# Patient Record
Sex: Female | Born: 1970 | Race: Black or African American | Hispanic: No | Marital: Single | Smoking: Never smoker
Health system: Southern US, Community
[De-identification: ages and names within clinical notes are randomized; demographics above are authoritative.]

## PROBLEM LIST (undated history)

## (undated) DIAGNOSIS — E119 Type 2 diabetes mellitus without complications: Secondary | ICD-10-CM

## (undated) DIAGNOSIS — U071 COVID-19: Secondary | ICD-10-CM

## (undated) DIAGNOSIS — I10 Essential (primary) hypertension: Secondary | ICD-10-CM

## (undated) HISTORY — PX: TUBAL LIGATION: SHX77

---

## 2012-07-01 ENCOUNTER — Emergency Department: Payer: Self-pay | Admitting: Emergency Medicine

## 2019-08-26 ENCOUNTER — Other Ambulatory Visit: Payer: Self-pay

## 2019-08-26 ENCOUNTER — Emergency Department: Payer: Medicaid Other

## 2019-08-26 DIAGNOSIS — J449 Chronic obstructive pulmonary disease, unspecified: Secondary | ICD-10-CM | POA: Diagnosis not present

## 2019-08-26 DIAGNOSIS — E119 Type 2 diabetes mellitus without complications: Secondary | ICD-10-CM | POA: Diagnosis not present

## 2019-08-26 DIAGNOSIS — Z20822 Contact with and (suspected) exposure to covid-19: Secondary | ICD-10-CM | POA: Diagnosis not present

## 2019-08-26 DIAGNOSIS — H4710 Unspecified papilledema: Secondary | ICD-10-CM | POA: Diagnosis not present

## 2019-08-26 LAB — CBC
HCT: 34.5 % — ABNORMAL LOW (ref 36.0–46.0)
Hemoglobin: 11.1 g/dL — ABNORMAL LOW (ref 12.0–15.0)
MCH: 27.4 pg (ref 26.0–34.0)
MCHC: 32.2 g/dL (ref 30.0–36.0)
MCV: 85.2 fL (ref 80.0–100.0)
Platelets: 355 10*3/uL (ref 150–400)
RBC: 4.05 MIL/uL (ref 3.87–5.11)
RDW: 14.3 % (ref 11.5–15.5)
WBC: 7.3 10*3/uL (ref 4.0–10.5)
nRBC: 0 % (ref 0.0–0.2)

## 2019-08-26 LAB — COMPREHENSIVE METABOLIC PANEL
ALT: 10 U/L (ref 0–44)
AST: 16 U/L (ref 15–41)
Albumin: 3.8 g/dL (ref 3.5–5.0)
Alkaline Phosphatase: 75 U/L (ref 38–126)
Anion gap: 11 (ref 5–15)
BUN: 10 mg/dL (ref 6–20)
CO2: 24 mmol/L (ref 22–32)
Calcium: 9.1 mg/dL (ref 8.9–10.3)
Chloride: 105 mmol/L (ref 98–111)
Creatinine, Ser: 0.76 mg/dL (ref 0.44–1.00)
GFR calc Af Amer: >60 mL/min (ref >60)
GFR calc non Af Amer: >60 mL/min (ref >60)
Glucose, Bld: 90 mg/dL (ref 70–99)
Potassium: 3.6 mmol/L (ref 3.5–5.1)
Sodium: 140 mmol/L (ref 135–145)
Total Bilirubin: 0.7 mg/dL (ref 0.3–1.2)
Total Protein: 8.2 g/dL — ABNORMAL HIGH (ref 6.5–8.1)

## 2019-08-26 IMAGING — MR MR HEAD WO/W CM
13 series · 48 of 48 positions shown · IV contrast (gadavist)
Comparison: None.

CLINICAL DATA: Papilledema

EXAM:
MRI HEAD WITHOUT AND WITH CONTRAST
MRV HEAD WITHOUT AND WITH CONTRAST
TECHNIQUE: Multiplanar, multiecho pulse sequences of the brain and surrounding
structures were obtained without and with intravenous contrast.
Angiographic images of the intracranial venous structures were
obtained using MRV technique without and with intravenous contrast.
CONTRAST:  10mL GADAVIST GADOBUTROL 1 MMOL/ML IV SOLN

[Series 5: ax dwi_tracew · axial · 3.0mm · 0.71mm/px · z∈[-117,+45]mm · 4 of 56 slices shown]
[im 1/56]
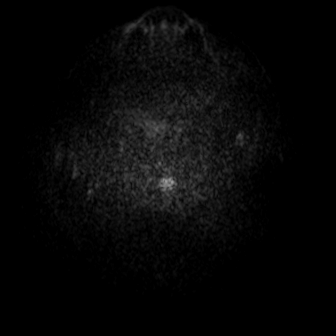
[im 19/56]
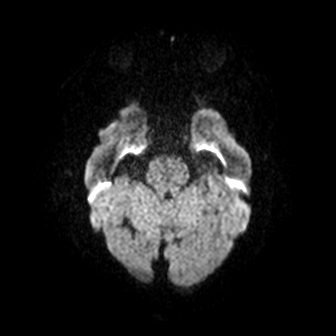
[im 37/56]
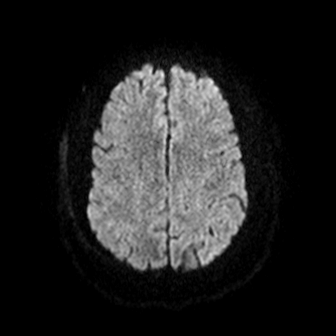
[im 56/56]
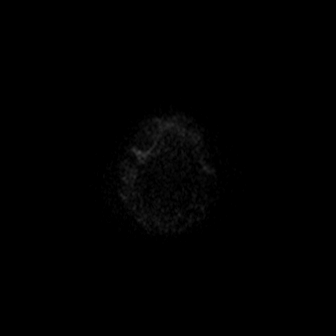

[Series 6: ax dwi_adc · axial · 3.0mm · 0.71mm/px · z∈[-117,+45]mm · 4 of 56 slices shown]
[im 1/56]
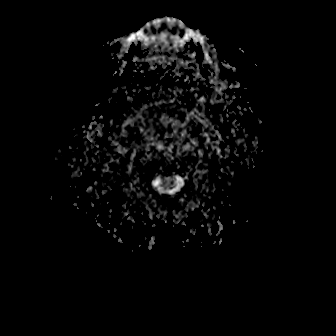
[im 19/56]
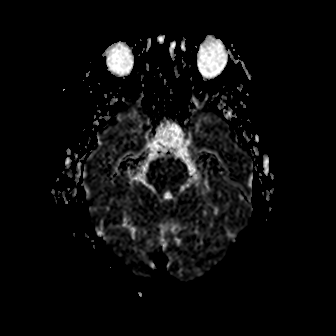
[im 37/56]
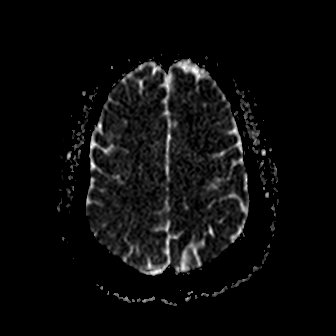
[im 56/56]
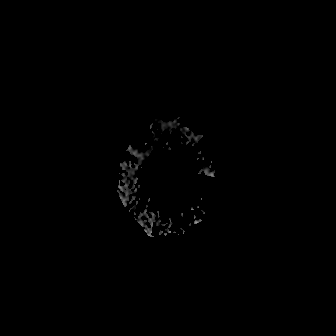

[Series 7: cor dwi_tracew · coronal · 5.0mm · 0.68mm/px · 3 of 40 slices shown]
[im 1/40]
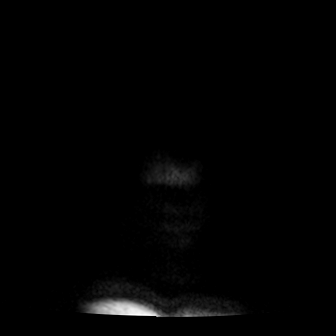
[im 20/40]
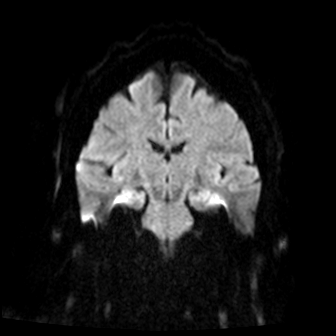
[im 40/40]
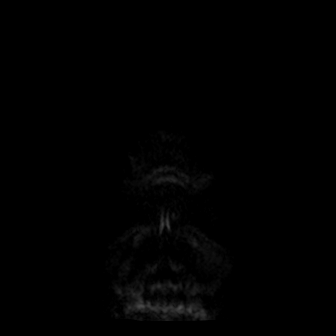

[Series 8: cor dwi_adc · coronal · 5.0mm · 0.68mm/px · 2 of 40 slices shown]
[im 1/40]
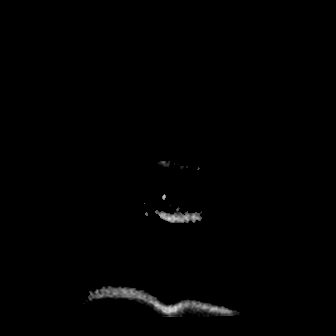
[im 40/40]
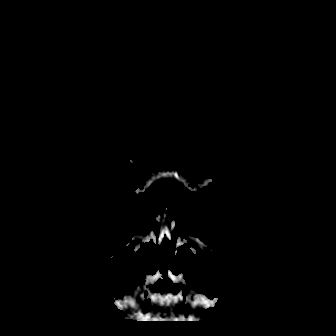

[Series 9: T1 · sagittal · 5.0mm · 0.94mm/px · 1 of 23 slices shown (1 of 2)]
[im 1/23]
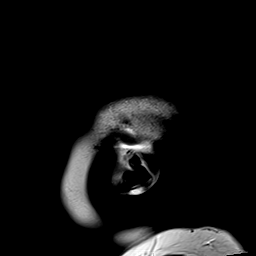

[Series 10: T2 · axial · 5.0mm · 0.53mm/px · 1 of 25 slices shown]
[im 1/25]
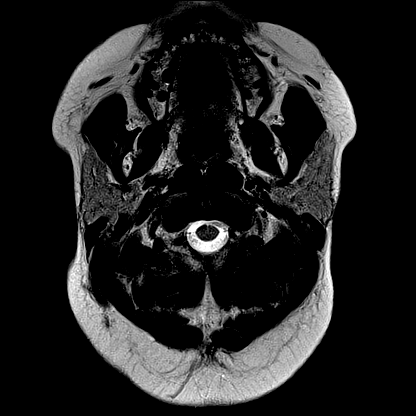

[Series 12: pha_images · axial · 3.0mm · 0.90mm/px · z∈[-119,+55]mm · 3 of 60 slices shown]
[im 1/60]
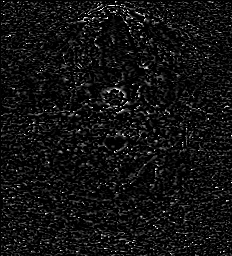
[im 30/60]
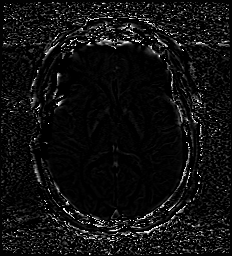
[im 60/60]
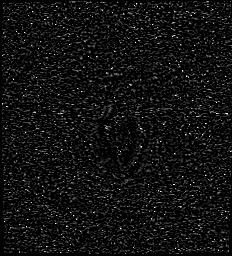

[Series 13: swi_images · axial · 3.0mm · 0.90mm/px · z∈[-119,+55]mm · 3 of 60 slices shown]
[im 1/60]
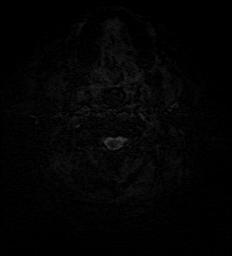
[im 30/60]
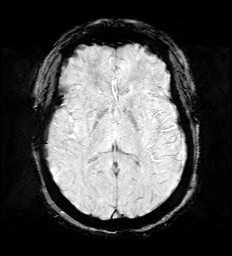
[im 60/60]
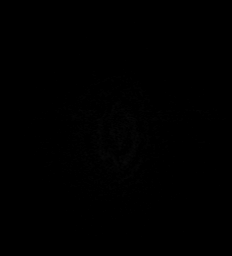

[Series 15: FLAIR · axial · 3.0mm · 0.53mm/px · z∈[-113,+46]mm · 3 of 55 slices shown]
[im 1/55]
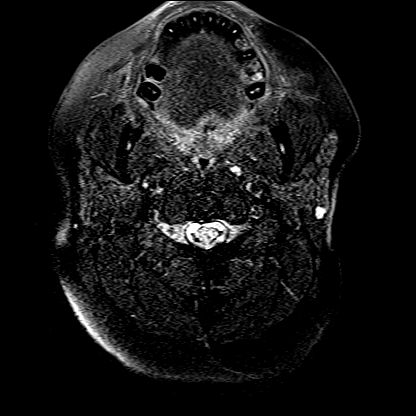
[im 28/55]
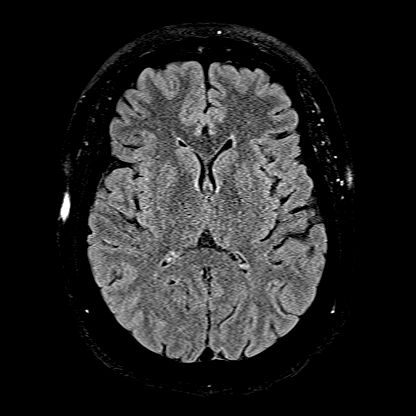
[im 55/55]
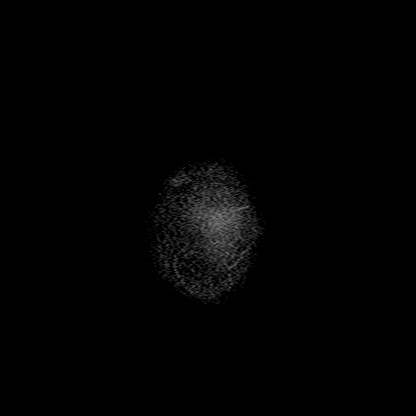

[Series 16: T1 · axial · 1.0mm · 0.98mm/px · z∈[-112,+59]mm · 10 of 176 slices shown (2 of 2)]
[im 1/176]
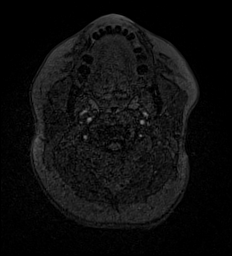
[im 20/176]
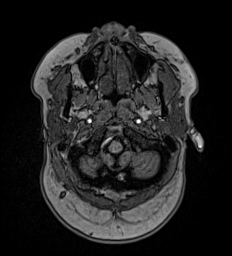
[im 39/176]
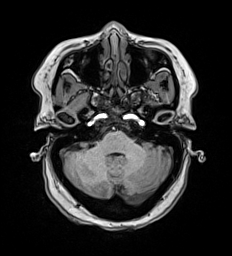
[im 59/176]
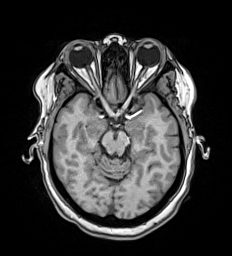
[im 78/176]
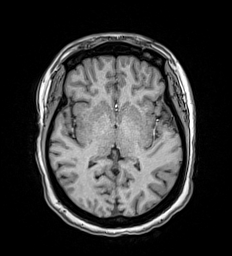
[im 98/176]
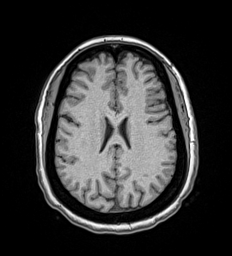
[im 117/176]
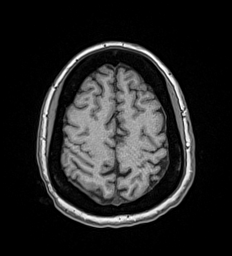
[im 137/176]
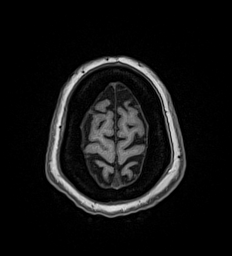
[im 156/176]
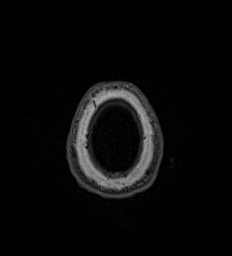
[im 176/176]
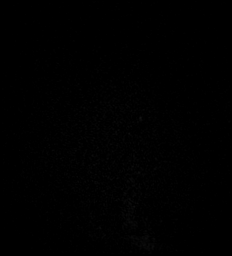

[Series 17: T2 post-contrast · coronal · 5.0mm · 0.57mm/px · 2 of 29 slices shown]
[im 1/29]
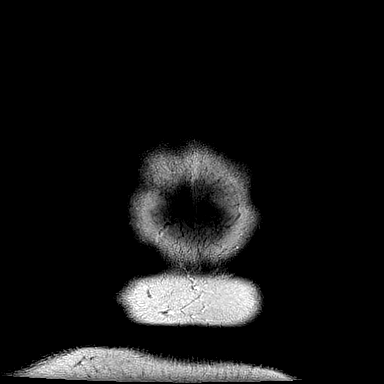
[im 29/29]
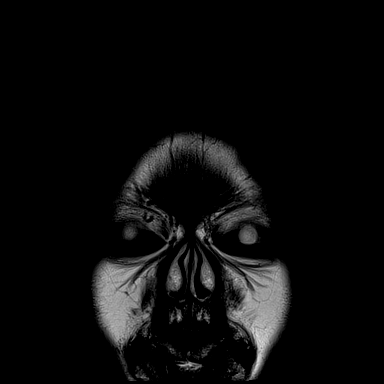

[Series 18: T1 post-contrast · axial · 1.0mm · 0.98mm/px · z∈[-112,+59]mm · 10 of 176 slices shown (1 of 2)]
[im 1/176]
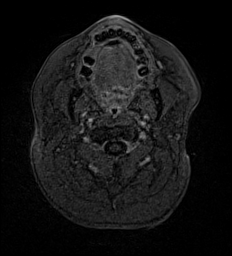
[im 20/176]
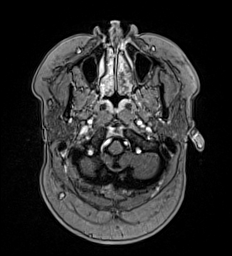
[im 39/176]
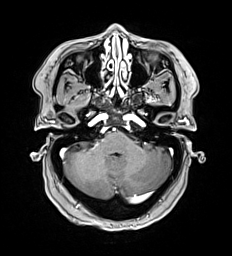
[im 59/176]
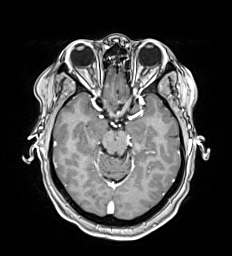
[im 78/176]
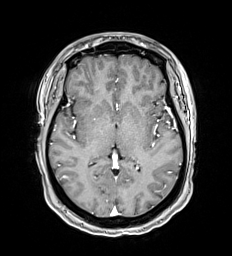
[im 98/176]
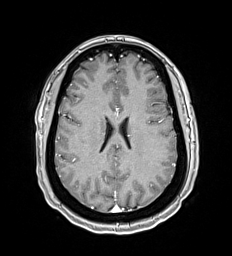
[im 117/176]
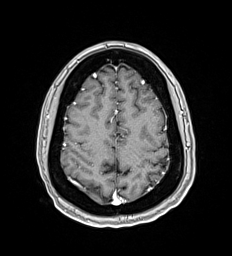
[im 137/176]
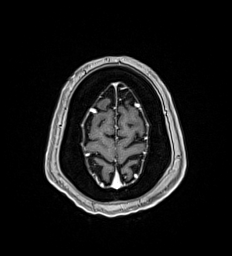
[im 156/176]
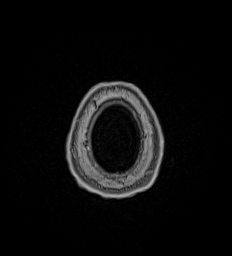
[im 176/176]
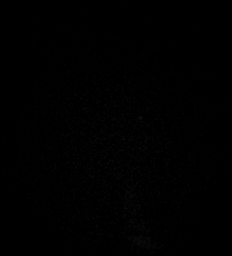

[Series 19: T1 post-contrast · coronal · 5.0mm · 0.57mm/px · 2 of 29 slices shown (2 of 2)]
[im 1/29]
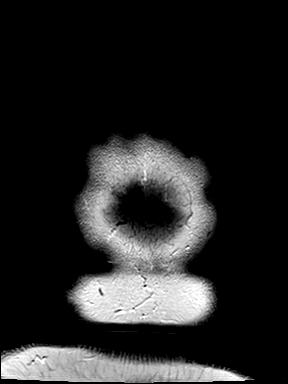
[im 29/29]
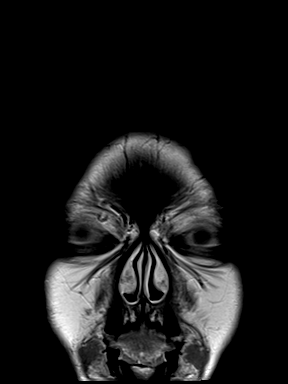

[48 of 48 positions shown; findings below may reference images not displayed]

FINDINGS: MRI head:

Brain: No focal parenchymal signal abnormality. No acute infarct or
intracranial hemorrhage. No midline shift, ventriculomegaly or
extra-axial fluid collection. No mass lesion. No abnormal
enhancement. Partially CSF filled sella turcica.

Vascular: Normal flow voids.

Skull and upper cervical spine: Normal marrow signal.

Sinuses/Orbits: Prominence of the bilateral optic nerve sheaths.
Otherwise unremarkable orbits. Mild left maxillary sinus mucosal
thickening. No mastoid effusion.

Other: None.

MRV head:

Normal appearance of the dural venous sinuses. No evidence of
thrombosis or high-grade narrowing.
IMPRESSION: No acute intracranial process.

Mild bilateral papilledema and partially CSF filled sella turcica
are suspicious for intracranial hypertension.

Normal appearance of the dural venous sinuses.

Mild left maxillary sinus disease.

## 2019-08-26 IMAGING — MR MR MRV HEAD WO/W CM
1 series · 48 of 48 positions shown · IV contrast (gadavist)
Comparison: None.

CLINICAL DATA: Papilledema

EXAM:
MRI HEAD WITHOUT AND WITH CONTRAST
MRV HEAD WITHOUT AND WITH CONTRAST
TECHNIQUE: Multiplanar, multiecho pulse sequences of the brain and surrounding
structures were obtained without and with intravenous contrast.
Angiographic images of the intracranial venous structures were
obtained using MRV technique without and with intravenous contrast.
CONTRAST:  10mL GADAVIST GADOBUTROL 1 MMOL/ML IV SOLN

[Series 1: TOF · coronal · 2.5mm · 0.98mm/px · 48 of 128 slices shown]
[im 1/128]
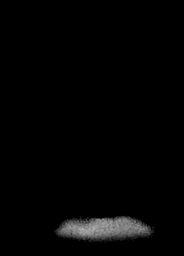
[im 3/128]
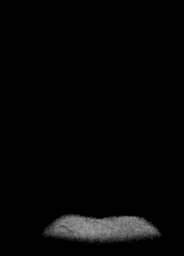
[im 6/128]
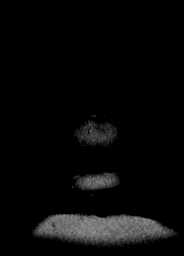
[im 9/128]
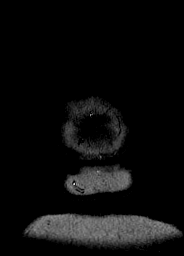
[im 11/128]
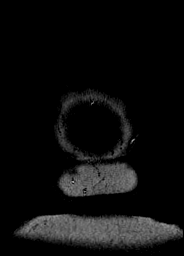
[im 14/128]
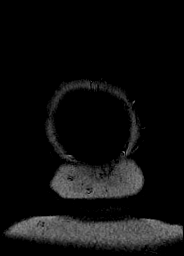
[im 17/128]
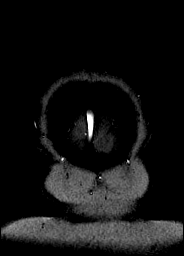
[im 19/128]
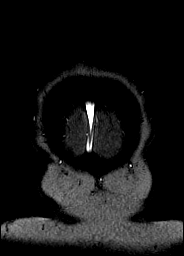
[im 22/128]
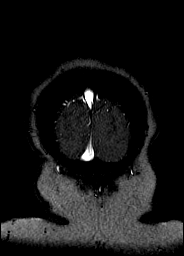
[im 25/128]
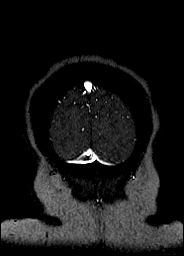
[im 28/128]
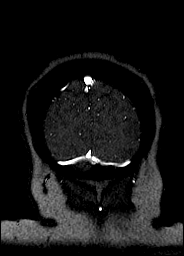
[im 30/128]
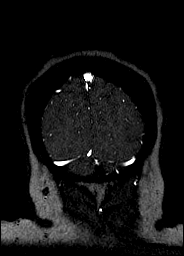
[im 33/128]
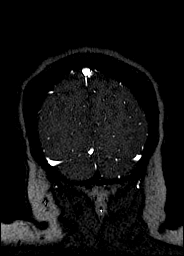
[im 36/128]
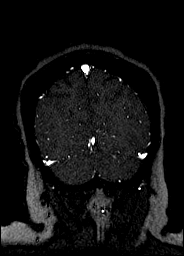
[im 38/128]
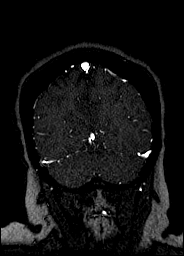
[im 41/128]
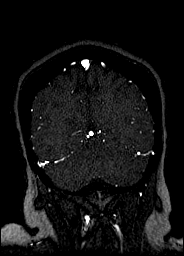
[im 44/128]
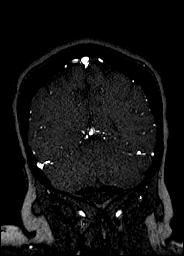
[im 46/128]
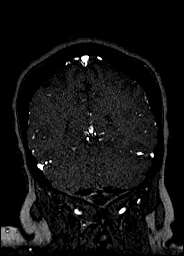
[im 49/128]
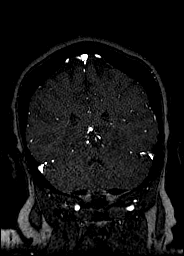
[im 52/128]
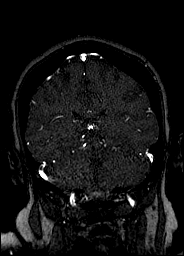
[im 55/128]
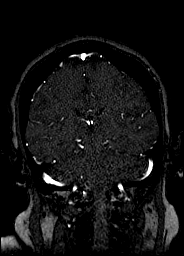
[im 57/128]
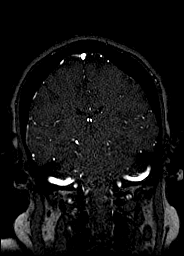
[im 60/128]
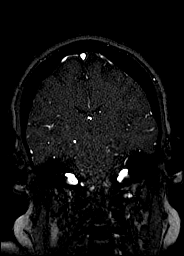
[im 63/128]
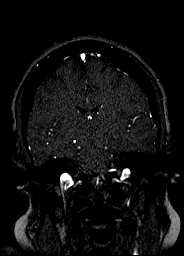
[im 65/128]
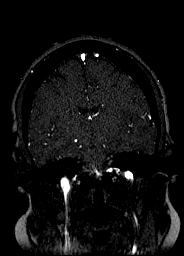
[im 68/128]
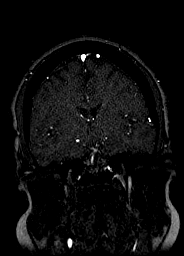
[im 71/128]
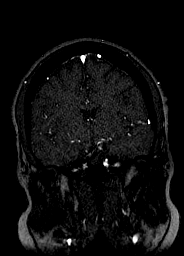
[im 73/128]
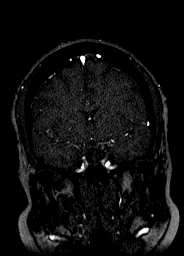
[im 76/128]
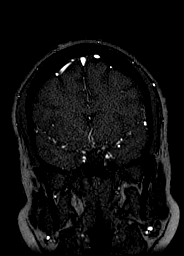
[im 79/128]
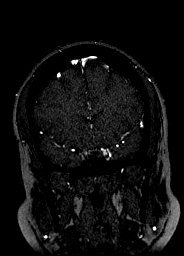
[im 82/128]
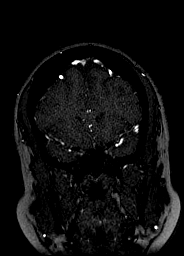
[im 84/128]
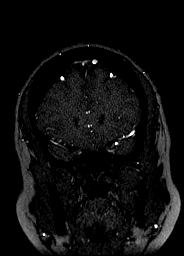
[im 87/128]
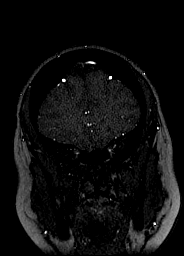
[im 90/128]
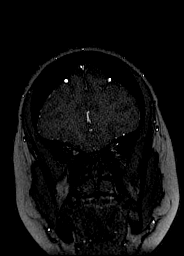
[im 92/128]
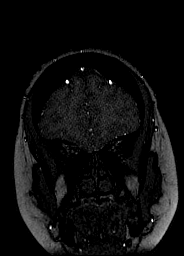
[im 95/128]
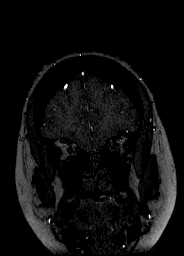
[im 98/128]
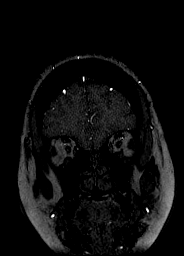
[im 100/128]
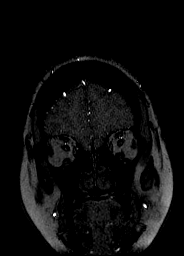
[im 103/128]
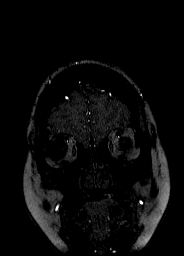
[im 106/128]
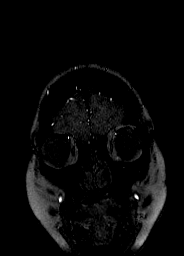
[im 109/128]
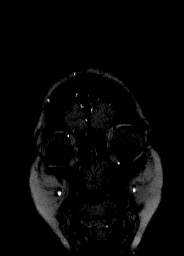
[im 111/128]
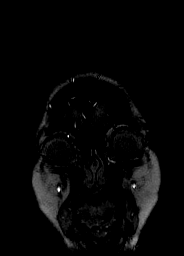
[im 114/128]
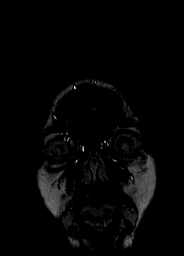
[im 117/128]
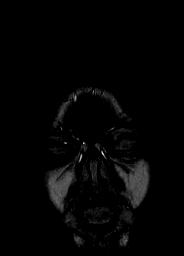
[im 119/128]
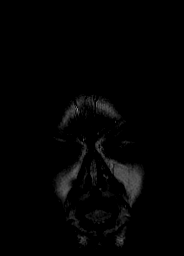
[im 122/128]
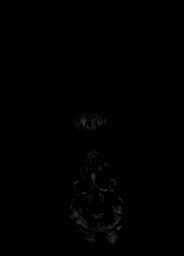
[im 125/128]
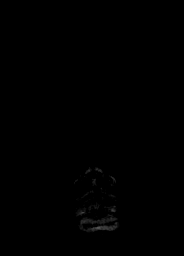
[im 128/128]
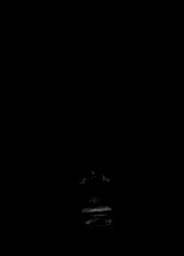

[48 of 48 positions shown; findings below may reference images not displayed]

FINDINGS: MRI head:

Brain: No focal parenchymal signal abnormality. No acute infarct or
intracranial hemorrhage. No midline shift, ventriculomegaly or
extra-axial fluid collection. No mass lesion. No abnormal
enhancement. Partially CSF filled sella turcica.

Vascular: Normal flow voids.

Skull and upper cervical spine: Normal marrow signal.

Sinuses/Orbits: Prominence of the bilateral optic nerve sheaths.
Otherwise unremarkable orbits. Mild left maxillary sinus mucosal
thickening. No mastoid effusion.

Other: None.

MRV head:

Normal appearance of the dural venous sinuses. No evidence of
thrombosis or high-grade narrowing.
IMPRESSION: No acute intracranial process.

Mild bilateral papilledema and partially CSF filled sella turcica
are suspicious for intracranial hypertension.

Normal appearance of the dural venous sinuses.

Mild left maxillary sinus disease.

## 2019-08-26 MED ORDER — GADOBUTROL 1 MMOL/ML IV SOLN
10.0000 mL | Freq: Once | INTRAVENOUS | Status: AC | PRN
  Administered 2019-08-26: 10 mL via INTRAVENOUS

## 2019-08-26 NOTE — ED Triage Notes (Signed)
Pt comes POV from doctor's office. Dx with papilledema in both eyes. MD wants stat MRI/MRV and neuro consult. Dr. Katrinka Blazing notified.

## 2019-08-26 NOTE — Progress Notes (Signed)
Pt brought to MRI from waiting room and returned to waiting room after successfully completing MRI Brain and MRV. IV was started in RT [facility] and left in place.

## 2019-08-27 ENCOUNTER — Emergency Department
Admission: EM | Admit: 2019-08-27 | Discharge: 2019-08-27 | Disposition: A | Discharge status: Home / Self Care | Payer: Medicaid Other | Attending: Emergency Medicine | Admitting: Emergency Medicine

## 2019-08-27 DIAGNOSIS — H4710 Unspecified papilledema: Secondary | ICD-10-CM

## 2019-08-27 LAB — SARS CORONAVIRUS 2 BY RT PCR (HOSPITAL ORDER, PERFORMED IN ~~LOC~~ HOSPITAL LAB): SARS Coronavirus 2: NEGATIVE

## 2019-08-27 NOTE — ED Provider Notes (Signed)
[facility] Emergency Department Provider Note   ____________________________________________   First MD Initiated Contact with Patient 08/27/19 0254     (approximate)  I have reviewed the triage vital signs and the nursing notes.   HISTORY  Chief Complaint Eye Problem    HPI Crystal Fernandez is a 49 y.o. female sent to the ED from [facility] for MRI/MRV and neurology evaluation.  Patient was sent to the [facility] by her PCP for several weeks history of headaches and vision changes.  She was found to have papilledema in both eyes and sent to the ED for work-up for concern of intracranial hypertension.  Denies fever, cough, chest pain, shortness of breath, abdominal pain, nausea, vomiting or diarrhea.       Past Medical History OSA COPD Severe acute respiratory syndrome secondary to Covid-19 Morbid obesity Diabetes mellitus  There are no problems to display for this patient.    Prior to Admission medications   Not on File    Allergies Penicillins  History reviewed. No pertinent family history.  Social History Social History   Tobacco Use  . Smoking status: Not on file  Substance Use Topics  . Alcohol use: Not on file  . Drug use: Not on file  Non-smoker  Review of Systems  Constitutional: No fever/chills Eyes: Positive for visual changes. ENT: No sore throat. Cardiovascular: Denies chest pain. Respiratory: Denies shortness of breath. Gastrointestinal: No abdominal pain.  No nausea, no vomiting.  No diarrhea.  No constipation. Genitourinary: Negative for dysuria. Musculoskeletal: Negative for back pain. Skin: Negative for rash. Neurological: Positive for headaches. Negative for focal weakness or numbness.   ____________________________________________   PHYSICAL EXAM:  VITAL SIGNS: ED Triage Vitals  Enc Vitals Group     BP 08/26/19 1726 (!) 151/102     Pulse Rate 08/26/19 1726 81     Resp 08/26/19 1726 18      Temp 08/26/19 1726 98.4 F (36.9 C)     Temp Source 08/26/19 1726 Oral     SpO2 08/26/19 1726 95 %     Weight 08/26/19 1727 (!) 331 lb (150.1 kg)     Height 08/26/19 1727 5\' 7"  (1.702 m)     Head Circumference --      Peak Flow --      Pain Score 08/26/19 1727 0     Pain Loc --      Pain Edu? --      Excl. in GC? --     Constitutional: Alert and oriented. Well appearing and in no acute distress. Eyes: Conjunctivae are normal. PERRL. EOMI. Funduscopy exam deferred as patient had eye examination by ophthalmologist in the afternoon. Head: Atraumatic. Nose: No congestion/rhinnorhea. Mouth/Throat: Mucous membranes are moist.   Neck: No stridor.  No carotid bruits.  Supple neck without meningismus. Cardiovascular: Normal rate, regular rhythm. Grossly normal heart sounds.  Good peripheral circulation. Respiratory: Normal respiratory effort.  No retractions. Lungs CTAB. Gastrointestinal: Obese.  Soft and nontender. No distention. No abdominal bruits. No CVA tenderness. Musculoskeletal: No lower extremity tenderness nor edema.  No joint effusions. Neurologic: Alert and oriented x3.  CN II-XII grossly intact.  Normal speech and language. No gross focal neurologic deficits are appreciated. No gait instability. Skin:  Skin is warm, dry and intact. No rash noted.  No petechiae. Psychiatric: Mood and affect are normal. Speech and behavior are normal.  ____________________________________________   LABS (all labs ordered are listed, but only abnormal results are  displayed)  Labs Reviewed  CBC - Abnormal; Notable for the following components:      Result Value   Hemoglobin 11.1 (*)    HCT 34.5 (*)    All other components within normal limits  COMPREHENSIVE METABOLIC PANEL - Abnormal; Notable for the following components:   Total Protein 8.2 (*)    All other components within normal limits  SARS CORONAVIRUS 2 BY RT PCR (HOSPITAL ORDER, PERFORMED IN [facility])    ____________________________________________  EKG  None ____________________________________________  RADIOLOGY  ED MD interpretation: MRI/MRV demonstrates mild bilateral papilledema suspicious for intracranial hypertension  Official radiology report(s): MR Brain W and Wo Contrast  Result Date: 08/26/2019 CLINICAL DATA:  Papilledema EXAM: MRI HEAD WITHOUT AND WITH CONTRAST MRV HEAD WITHOUT AND WITH CONTRAST TECHNIQUE: Multiplanar, multiecho pulse sequences of the brain and surrounding structures were obtained without and with intravenous contrast. Angiographic images of the intracranial venous structures were obtained using MRV technique without and with intravenous contrast. CONTRAST:  74mL GADAVIST GADOBUTROL 1 MMOL/ML IV SOLN COMPARISON:  None. FINDINGS: MRI head: Brain: No focal parenchymal signal abnormality. No acute infarct or intracranial hemorrhage. No midline shift, ventriculomegaly or extra-axial fluid collection. No mass lesion. No abnormal enhancement. Partially CSF filled sella turcica. Vascular: Normal flow voids. Skull and upper cervical spine: Normal marrow signal. Sinuses/Orbits: Prominence of the bilateral optic nerve sheaths. Otherwise unremarkable orbits. Mild left maxillary sinus mucosal thickening. No mastoid effusion. Other: None. MRV head: Normal appearance of the dural venous sinuses. No evidence of thrombosis or high-grade narrowing. IMPRESSION: No acute intracranial process. Mild bilateral papilledema and partially CSF filled sella turcica are suspicious for intracranial hypertension. Normal appearance of the dural venous sinuses. Mild left maxillary sinus disease. Electronically Signed   By: Stana Bunting M.D.   On: 08/26/2019 20:24   MR MRV HEAD W WO CONTRAST  Result Date: 08/26/2019 CLINICAL DATA:  Papilledema EXAM: MRI HEAD WITHOUT AND WITH CONTRAST MRV HEAD WITHOUT AND WITH CONTRAST TECHNIQUE: Multiplanar, multiecho pulse sequences of the brain and  surrounding structures were obtained without and with intravenous contrast. Angiographic images of the intracranial venous structures were obtained using MRV technique without and with intravenous contrast. CONTRAST:  59mL GADAVIST GADOBUTROL 1 MMOL/ML IV SOLN COMPARISON:  None. FINDINGS: MRI head: Brain: No focal parenchymal signal abnormality. No acute infarct or intracranial hemorrhage. No midline shift, ventriculomegaly or extra-axial fluid collection. No mass lesion. No abnormal enhancement. Partially CSF filled sella turcica. Vascular: Normal flow voids. Skull and upper cervical spine: Normal marrow signal. Sinuses/Orbits: Prominence of the bilateral optic nerve sheaths. Otherwise unremarkable orbits. Mild left maxillary sinus mucosal thickening. No mastoid effusion. Other: None. MRV head: Normal appearance of the dural venous sinuses. No evidence of thrombosis or high-grade narrowing. IMPRESSION: No acute intracranial process. Mild bilateral papilledema and partially CSF filled sella turcica are suspicious for intracranial hypertension. Normal appearance of the dural venous sinuses. Mild left maxillary sinus disease. Electronically Signed   By: Stana Bunting M.D.   On: 08/26/2019 20:24    ____________________________________________   PROCEDURES  Procedure(s) performed (including Critical Care):  Procedures   ____________________________________________   INITIAL IMPRESSION / ASSESSMENT AND PLAN / ED COURSE  As part of my medical decision making, I reviewed the following data within the electronic MEDICAL RECORD NUMBER Nursing notes reviewed and incorporated, Labs reviewed, Old chart reviewed, Radiograph reviewed, Discussed with admitting physician and Notes from prior ED visits     SAMORA JERNBERG was evaluated in Emergency Department  on 08/27/2019 for the symptoms described in the history of present illness. She was evaluated in the context of the global COVID-19 pandemic, which  necessitated consideration that the patient might be at risk for infection with the SARS-CoV-2 virus that causes COVID-19. Institutional protocols and algorithms that pertain to the evaluation of patients at risk for COVID-19 are in a state of rapid change based on information released by regulatory bodies including the CDC and federal and state organizations. These policies and algorithms were followed during the patient's care in the ED.    49 year old female sent from ophthalmology for further evaluation of headaches, vision changes, papilledema seen on exam. Differential diagnosis includes, but is not limited to, intracranial hypertension, intracranial hemorrhage, meningitis/encephalitis, previous head trauma, cavernous venous thrombosis, tension headache, temporal arteritis, migraine or migraine equivalent, idiopathic intracranial hypertension, and non-specific headache.  Laboratory results unremarkable; MRI/MRV concerning for intracranial hypertension.  Will discuss with hospitalist services for admission for LP under fluoroscopy and neurology evaluation.   Clinical Course as of Aug 27 522  Sat Aug 27, 2019  [id number] Patient desiring to leave AMA.  She has several family issues that she needs to take care of of.  States she will return Monday morning around 4 AM.  We discussed risks of leaving including blindness, permanent disability from headaches, neurological deficits and death.  She understands this and vows to return in 48 hours.  Strict return precautions given.  Patient and spouse verbalized understanding and agree with plan of care.   [JS]    Clinical Course User Index [JS] Irean Hong, MD     ____________________________________________   FINAL CLINICAL IMPRESSION(S) / ED DIAGNOSES  Final diagnoses:  Papilledema     ED Discharge Orders    None       Note:  This document was prepared using Dragon voice recognition software and may include unintentional dictation  errors.   Irean Hong, MD 08/27/19 [phone]

## 2019-08-27 NOTE — Discharge Instructions (Addendum)
You have decided to leave AGAINST MEDICAL ADVICE.  Please return to the ED as soon as you are able to complete your work-up for intracranial hypertension.  You may need to be admitted to the hospital for this.  Return sooner for worsening symptoms, persistent vomiting, lethargy or other concerns.

## 2019-08-27 NOTE — ED Notes (Signed)
MD has talked to patient about leaving AMA. PT in her right mind and stable. PT anxious about her family and reports there are some things she has to get in order before she can be hospitalized. Boyfriend willing to drive patient. Pt ambulatory at time of discharge.

## 2019-08-27 NOTE — ED Notes (Signed)
Pt reports headaches x3 months  Pt reports covid last April, intubation for several days, hospitalized for a month. 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

## 2019-08-29 ENCOUNTER — Emergency Department
Admission: EM | Admit: 2019-08-29 | Discharge: 2019-08-29 | Disposition: A | Discharge status: Home / Self Care | Payer: Medicaid Other | Attending: Emergency Medicine | Admitting: Emergency Medicine

## 2019-08-29 ENCOUNTER — Encounter: Payer: Self-pay | Admitting: Emergency Medicine

## 2019-08-29 ENCOUNTER — Other Ambulatory Visit: Payer: Self-pay

## 2019-08-29 DIAGNOSIS — Z5321 Procedure and treatment not carried out due to patient leaving prior to being seen by health care provider: Secondary | ICD-10-CM | POA: Insufficient documentation

## 2019-08-29 DIAGNOSIS — H4711 Papilledema associated with increased intracranial pressure: Secondary | ICD-10-CM | POA: Insufficient documentation

## 2019-08-29 HISTORY — DX: Type 2 diabetes mellitus without complications: E11.9

## 2019-08-29 HISTORY — DX: Essential (primary) hypertension: I10

## 2019-08-29 HISTORY — DX: COVID-19: U07.1

## 2019-08-29 NOTE — ED Triage Notes (Signed)
Patient states that she was seen here on Friday for papilledema and the md wanted to admit her but she had family concerns to address. Patient states that she is now back to be admitted.

## 2020-03-19 ENCOUNTER — Other Ambulatory Visit: Payer: Self-pay | Admitting: Orthopedic Surgery

## 2020-03-19 DIAGNOSIS — M25512 Pain in left shoulder: Secondary | ICD-10-CM

## 2020-03-23 ENCOUNTER — Other Ambulatory Visit: Payer: Self-pay

## 2020-03-23 ENCOUNTER — Ambulatory Visit
Admission: RE | Admit: 2020-03-23 | Discharge: 2020-03-23 | Disposition: A | Discharge status: Home / Self Care | Payer: Medicaid Other | Source: Ambulatory Visit | Attending: Orthopedic Surgery | Admitting: Orthopedic Surgery

## 2020-03-23 DIAGNOSIS — M25512 Pain in left shoulder: Secondary | ICD-10-CM | POA: Insufficient documentation

## 2020-03-23 IMAGING — MR MR SHOULDER*L* W/O CM
7 series · 40 of 40 positions shown · non-contrast
Comparison: X-ray [DATE]

CLINICAL DATA: Left shoulder pain and limited range of motion for
1-2 years

EXAM:
MRI OF THE LEFT SHOULDER WITHOUT CONTRAST
TECHNIQUE: Multiplanar, multisequence MR imaging of the shoulder was performed.
No intravenous contrast was administered.

[Series 9: T2 fat-sat · axial · left · 4.0mm · 0.62mm/px · z∈[-87,+33]mm · 6 of 26 slices shown (1 of 4)]
[im 1/26]
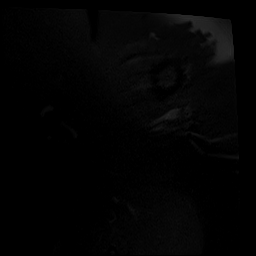
[im 6/26]
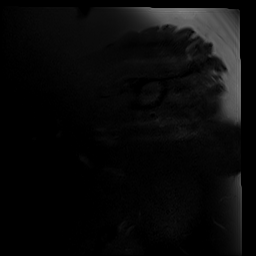
[im 11/26]
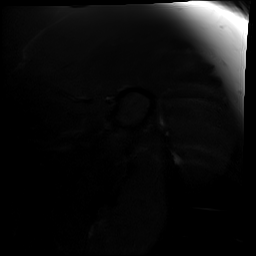
[im 16/26]
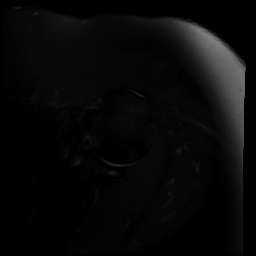
[im 21/26]
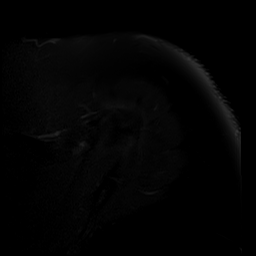
[im 26/26]
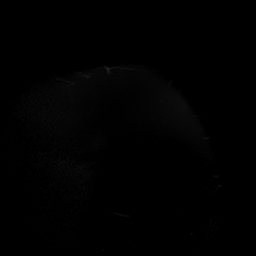

[Series 10: STIR · axial · left · 4.0mm · 0.62mm/px · z∈[-87,+33]mm · 6 of 26 slices shown]
[im 1/26]
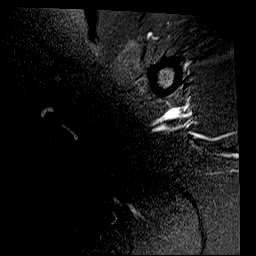
[im 6/26]
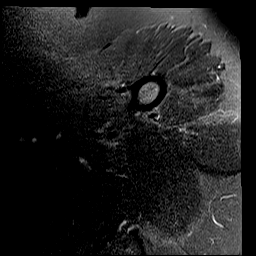
[im 11/26]
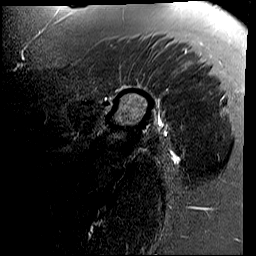
[im 16/26]
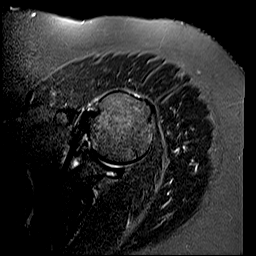
[im 21/26]
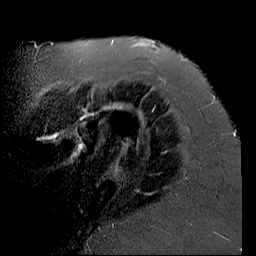
[im 26/26]
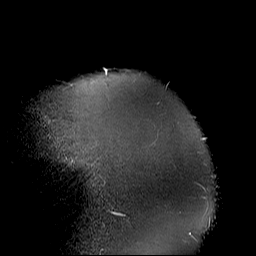

[Series 11: PD · sagittal · left · 4.0mm · 0.47mm/px · 6 of 26 slices shown]
[im 1/26]
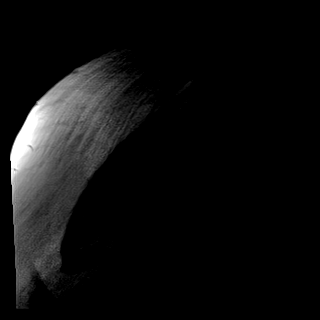
[im 6/26]
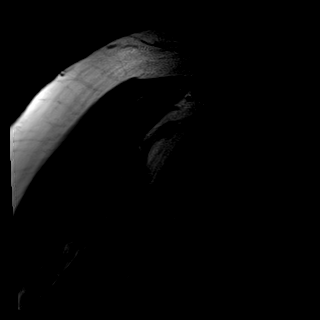
[im 11/26]
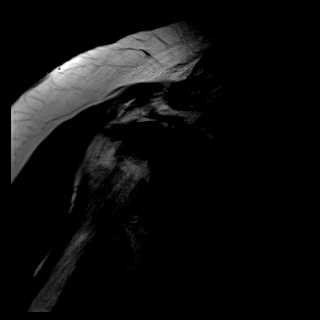
[im 16/26]
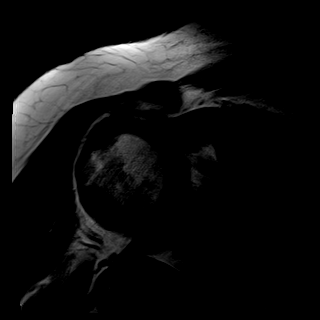
[im 21/26]
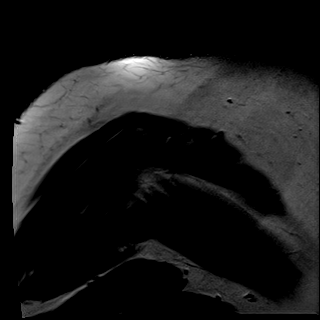
[im 26/26]
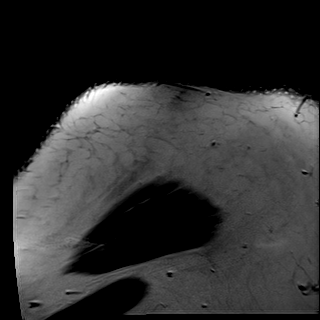

[Series 12: T2 fat-sat · sagittal · left · 4.0mm · 0.47mm/px · 6 of 26 slices shown (2 of 4)]
[im 1/26]
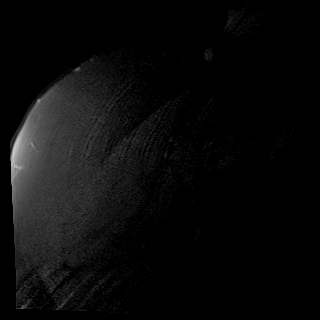
[im 6/26]
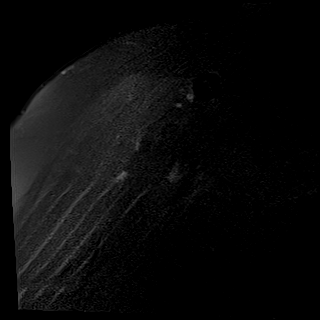
[im 11/26]
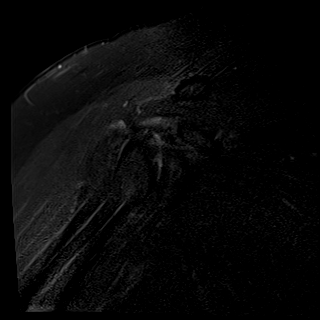
[im 16/26]
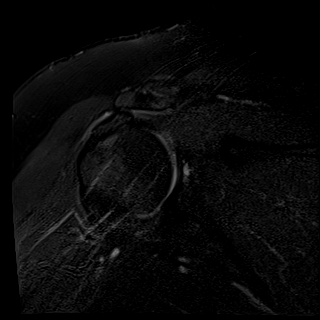
[im 21/26]
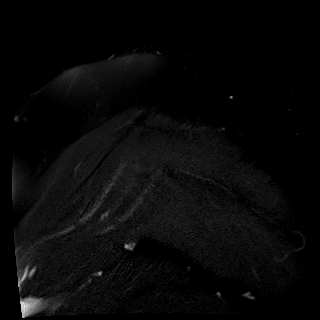
[im 26/26]
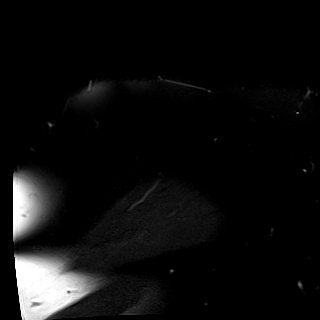

[Series 13: T2 fat-sat · coronal · left · 4.0mm · 0.23mm/px · 5 of 22 slices shown (3 of 4)]
[im 1/22]
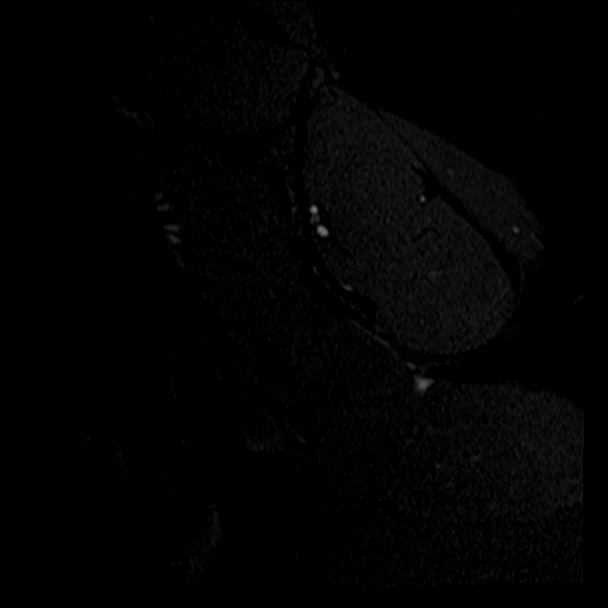
[im 6/22]
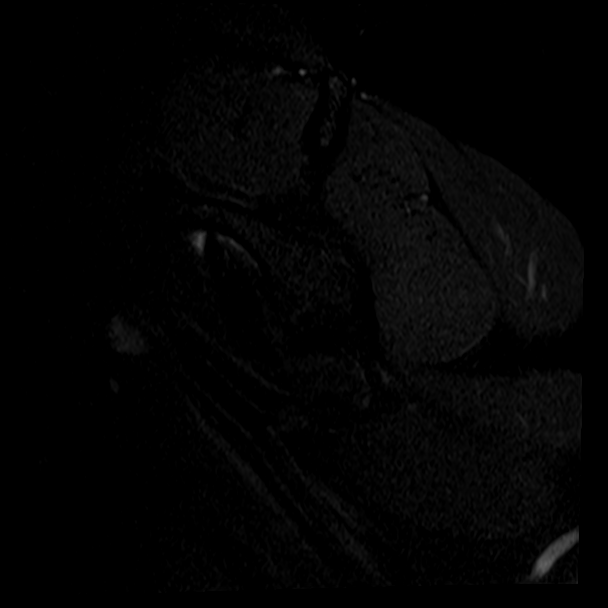
[im 11/22]
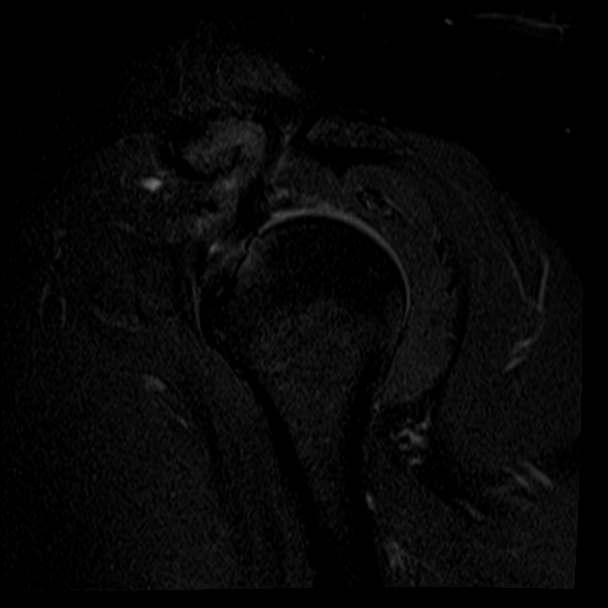
[im 16/22]
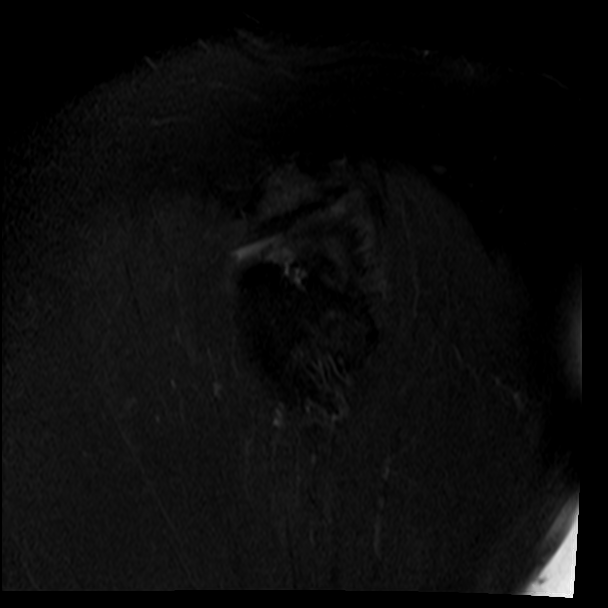
[im 22/22]
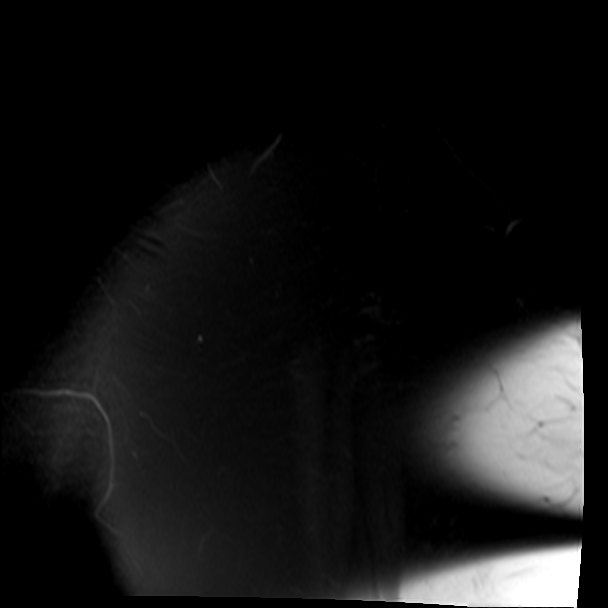

[Series 14: T1 · coronal · left · 4.0mm · 0.55mm/px · 5 of 22 slices shown]
[im 1/22]
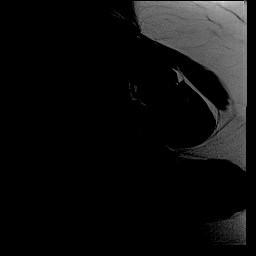
[im 6/22]
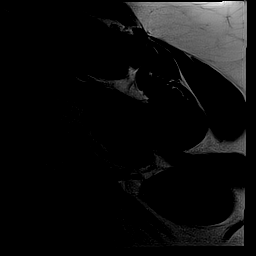
[im 11/22]
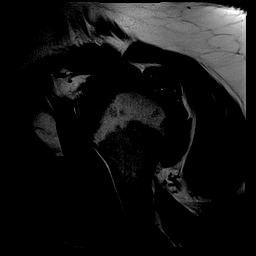
[im 16/22]
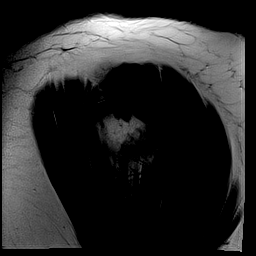
[im 22/22]
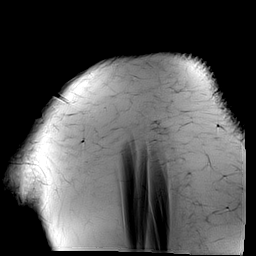

[Series 15: T2 fat-sat · sagittal · left · 4.0mm · 0.59mm/px · 6 of 26 slices shown (4 of 4)]
[im 1/26]
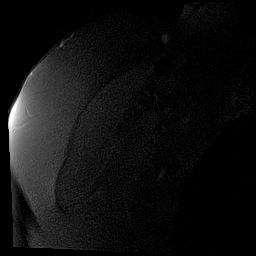
[im 6/26]
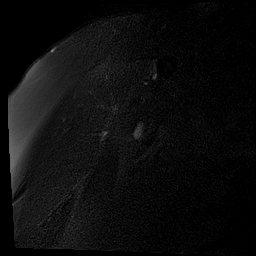
[im 11/26]
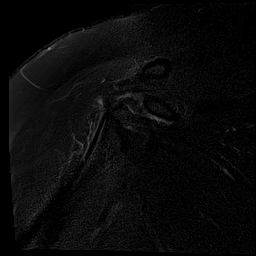
[im 16/26]
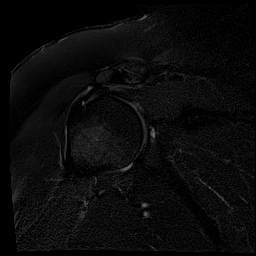
[im 21/26]
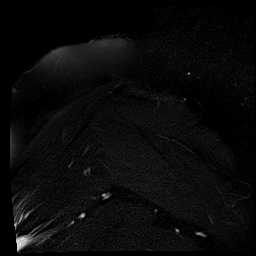
[im 26/26]
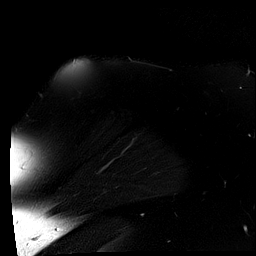

[40 of 40 positions shown; findings below may reference images not displayed]

FINDINGS: Rotator cuff: Moderate supraspinatus and infraspinatus tendinosis.
Tiny interstitial tear at the mid to posterior supraspinatus tendon
insertion (series 15, image 15). Partial-thickness bursal sided
defect of the anterior to mid infraspinatus tendon in the region of
the critical zone (series 15, images 16-18). Tear appears to involve
at least 50% of the tendon depth at this location. Mild
subscapularis tendinosis. Teres minor intact. No full-thickness
rotator cuff tear.

Muscles: Preserved bulk and signal intensity of the rotator cuff
musculature without edema, atrophy, or fatty infiltration.

Biceps long head: Intact. Evaluation of the intra-articular
component is somewhat limited by internal rotation.

Acromioclavicular Joint: Moderate arthropathy of the AC joint with
prominent inferiorly oriented acromial and distal clavicular
osteophytes. Trace subacromial-subdeltoid bursal fluid.

Glenohumeral Joint: Mild chondral thinning without discernible
cartilage defect. No joint effusion.

Labrum: Grossly intact, but evaluation is limited by lack of
intraarticular fluid.

Bones: No acute fracture or dislocation. Marrow signal within normal
limits. No suspicious bone lesion.

Other: None.
IMPRESSION: 1. Partial-thickness bursal sided tear of the infraspinatus tendon
in the region of the critical zone. Tear appears to involve at least
50% of the tendon depth at this location.
2. Moderate supraspinatus with tiny interstitial tear.
3. Moderate AC joint osteoarthritis with prominent
inferiorly-oriented osteophytes.

## 2023-10-26 ENCOUNTER — Other Ambulatory Visit: Payer: Self-pay | Admitting: Neurology

## 2023-10-26 DIAGNOSIS — M79605 Pain in left leg: Secondary | ICD-10-CM

## 2023-10-27 ENCOUNTER — Ambulatory Visit
Admission: RE | Admit: 2023-10-27 | Discharge: 2023-10-27 | Disposition: A | Discharge status: Home / Self Care | Payer: Self-pay | Source: Ambulatory Visit | Attending: Neurology | Admitting: Neurology

## 2023-10-27 DIAGNOSIS — M79605 Pain in left leg: Secondary | ICD-10-CM | POA: Insufficient documentation
